# Patient Record
Sex: Male | Born: 1985 | Hispanic: Yes | Marital: Single | Smoking: Never smoker
Health system: Southern US, Community
[De-identification: ages and names within clinical notes are randomized; demographics above are authoritative.]

## PROBLEM LIST (undated history)

## (undated) DIAGNOSIS — H938X9 Other specified disorders of ear, unspecified ear: Secondary | ICD-10-CM

---

## 2018-01-14 ENCOUNTER — Other Ambulatory Visit: Payer: Self-pay | Admitting: Otolaryngology

## 2018-01-14 DIAGNOSIS — H938X1 Other specified disorders of right ear: Secondary | ICD-10-CM

## 2018-01-23 ENCOUNTER — Ambulatory Visit
Admission: RE | Admit: 2018-01-23 | Discharge: 2018-01-23 | Disposition: A | Discharge status: Home / Self Care | Payer: No Typology Code available for payment source | Source: Ambulatory Visit | Attending: Otolaryngology | Admitting: Otolaryngology

## 2018-01-23 DIAGNOSIS — H938X1 Other specified disorders of right ear: Secondary | ICD-10-CM

## 2018-02-13 NOTE — H&P (Signed)
  HPI:   Ryan Brooks is a 32 y.o. male who presents as a consult Patient.   Referring Provider: Barry BrunnerLonergan, Malia, PA-C  Chief complaint: Ear problem.  HPI: 2-week history of clogged up right ear. He did not have any trouble with the ear prior to that as far as he knows. He is having hard time hearing from that ear.  PMH/Meds/All/SocHx/FamHx/ROS:   History reviewed. No pertinent past medical history.  History reviewed. No pertinent surgical history.  No family history of bleeding disorders, wound healing problems or difficulty with anesthesia.   Social History   Social History  . Marital status: N/A  Spouse name: N/A  . Number of children: N/A  . Years of education: N/A   Occupational History  . Not on file.   Social History Main Topics  . Smoking status: Never Smoker  . Smokeless tobacco: Never Used  . Alcohol use Not on file  . Drug use: Unknown  . Sexual activity: Not on file   Other Topics Concern  . Not on file   Social History Narrative  . No narrative on file   Current Outpatient Prescriptions:  . neomycin-polymyxin-hydrocortisone (CORTISPORIN) 3.5-10,000-1 mg/mL-unit/mL-% otic suspension, Place 3 drops into the right ear 4 times daily for 14 days., Disp: 10 mL, Rfl: 1  A complete ROS was performed with pertinent positives/negatives noted in the HPI. The remainder of the ROS are negative.   Physical Exam:   There were no vitals taken for this visit.  General: Healthy and alert, in no distress, breathing easily. Normal affect. In a pleasant mood. Head: Normocephalic, atraumatic. No masses, or scars. Eyes: Pupils are equal, and reactive to light. Vision is grossly intact. No spontaneous or gaze nystagmus. Ears: Left ear canal, tympanic membrane and middle ear are normal to inspection. Right ear canal completely filled with polypoid mass. Part of this was removed for biopsy. Topical Afrin was applied for hemostasis. Cortisporin was then dripped into the ear  canal. Hearing: Grossly normal. Nose: Nasal cavities are clear with healthy mucosa, no polyps or exudate.Airways are patent. Face: No masses or scars, facial nerve function is symmetric. Oral Cavity: No mucosal abnormalities are noted. Tongue with normal mobility. Dentition appears healthy. Oropharynx: Tonsils are symmetric. There are no mucosal masses identified. Tongue base appears normal and healthy. Larynx/Hypopharynx: deferred Chest: Deferred Neck: No palpable masses, no cervical adenopathy, no thyroid nodules or enlargement. Neuro: Cranial nerves II-XII will normal function. Balance: Normal gate. Other findings: none.  Independent Review of Additional Tests or Records:  none  Procedures:  Procedure note:  Indications: External otitis  Details of ear canal cleaning were discussed with the patient and all questions were answered.  Procedure:  Using the operating microscope, the right side was cleaned of polypoid mass using otologic forceps.   He tolerated this procedure well. There were no complications.  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 Await pathology. Start on Cortisporin drops for now. Recheck in 1 week.

## 2018-02-20 ENCOUNTER — Other Ambulatory Visit: Payer: Self-pay

## 2018-02-20 ENCOUNTER — Encounter (HOSPITAL_COMMUNITY): Payer: Self-pay | Admitting: *Deleted

## 2018-02-20 NOTE — Progress Notes (Signed)
Pt SDW-Pre-op call completed using [employer], Katie # [phone]. Pt denies SOB, chest pain, and being under the care of a cardiologist. Pt denies having a stress test, echo and cardiac cath. Pt denies having an EKG and chest x ray within the last year. Pt denies recent labs. Pt made aware to stop taking Aspirin, vitamins, fish oil and herbal medications. Do not take any NSAIDs ie: Ibuprofen, Advil, Naproxen (Aleve), Motrin, BC and Goody Powder. Pt verbalized understanding of all pre-op instructions.

## 2018-02-20 NOTE — Anesthesia Preprocedure Evaluation (Signed)
Anesthesia Evaluation  Patient identified by MRN, date of birth, ID band Patient awake    Reviewed: Allergy & Precautions, H&P , Patient's Chart, lab work & pertinent test results, reviewed documented beta blocker date and time   Airway Mallampati: II  TM Distance: >3 FB Neck ROM: full    Dental no notable dental hx.    Pulmonary    Pulmonary exam normal breath sounds clear to auscultation       Cardiovascular  Rhythm:regular Rate:Normal     Neuro/Psych    GI/Hepatic   Endo/Other    Renal/GU      Musculoskeletal   Abdominal   Peds  Hematology   Anesthesia Other Findings   Reproductive/Obstetrics                             Anesthesia Physical Anesthesia Plan  ASA: II  Anesthesia Plan: General   Post-op Pain Management:    Induction: Intravenous  PONV Risk Score and Plan: 2 and Treatment may vary due to age or medical condition, Dexamethasone and Ondansetron  Airway Management Planned: LMA  Additional Equipment:   Intra-op Plan:   Post-operative Plan:   Informed Consent: I have reviewed the patients History and Physical, chart, labs and discussed the procedure including the risks, benefits and alternatives for the proposed anesthesia with the patient or authorized representative who has indicated his/her understanding and acceptance.   Dental Advisory Given  Plan Discussed with: CRNA and Surgeon  Anesthesia Plan Comments: ( )        Anesthesia Quick Evaluation

## 2018-02-21 ENCOUNTER — Ambulatory Visit (HOSPITAL_COMMUNITY)
Admission: RE | Admit: 2018-02-21 | Discharge: 2018-02-21 | Disposition: A | Discharge status: Home / Self Care | Payer: No Typology Code available for payment source | Source: Ambulatory Visit | Attending: Otolaryngology | Admitting: Otolaryngology

## 2018-02-21 ENCOUNTER — Encounter (HOSPITAL_COMMUNITY)
Admission: RE | Disposition: A | Discharge status: Home / Self Care | Payer: Self-pay | Source: Ambulatory Visit | Attending: Otolaryngology

## 2018-02-21 ENCOUNTER — Encounter (HOSPITAL_COMMUNITY): Payer: Self-pay | Admitting: General Practice

## 2018-02-21 ENCOUNTER — Ambulatory Visit (HOSPITAL_COMMUNITY): Payer: Self-pay | Admitting: Anesthesiology

## 2018-02-21 DIAGNOSIS — H938X1 Other specified disorders of right ear: Secondary | ICD-10-CM | POA: Insufficient documentation

## 2018-02-21 DIAGNOSIS — H608X1 Other otitis externa, right ear: Secondary | ICD-10-CM | POA: Insufficient documentation

## 2018-02-21 HISTORY — PX: EAR CYST EXCISION: SHX22

## 2018-02-21 HISTORY — DX: Other specified disorders of ear, unspecified ear: H93.8X9

## 2018-02-21 SURGERY — EXCISION, CYST, EAR
Anesthesia: General | Site: Ear | Laterality: Right

## 2018-02-21 MED ORDER — 0.9 % SODIUM CHLORIDE (POUR BTL) OPTIME
TOPICAL | Status: DC | PRN
  Administered 2018-02-21: 1000 mL

## 2018-02-21 MED ORDER — LACTATED RINGERS IV SOLN
INTRAVENOUS | Status: DC | PRN
  Administered 2018-02-21: 07:00:00 via INTRAVENOUS

## 2018-02-21 MED ORDER — ONDANSETRON HCL 4 MG/2ML IJ SOLN
INTRAMUSCULAR | Status: AC
  Filled 2018-02-21: qty 2

## 2018-02-21 MED ORDER — OXYMETAZOLINE HCL 0.05 % NA SOLN
NASAL | Status: DC | PRN
  Administered 2018-02-21: 1

## 2018-02-21 MED ORDER — FENTANYL CITRATE (PF) 100 MCG/2ML IJ SOLN: 25.0000 ug | INTRAMUSCULAR | Status: DC | PRN

## 2018-02-21 MED ORDER — FENTANYL CITRATE (PF) 250 MCG/5ML IJ SOLN
INTRAMUSCULAR | Status: AC
  Filled 2018-02-21: qty 5

## 2018-02-21 MED ORDER — ONDANSETRON HCL 4 MG/2ML IJ SOLN
INTRAMUSCULAR | Status: DC | PRN
  Administered 2018-02-21: 4 mg via INTRAVENOUS

## 2018-02-21 MED ORDER — DEXAMETHASONE SODIUM PHOSPHATE 10 MG/ML IJ SOLN
INTRAMUSCULAR | Status: AC
  Filled 2018-02-21: qty 1

## 2018-02-21 MED ORDER — LIDOCAINE-EPINEPHRINE 1 %-1:100000 IJ SOLN
INTRAMUSCULAR | Status: AC
  Filled 2018-02-21: qty 1

## 2018-02-21 MED ORDER — PROPOFOL 10 MG/ML IV BOLUS
INTRAVENOUS | Status: DC | PRN
  Administered 2018-02-21: 200 mg via INTRAVENOUS

## 2018-02-21 MED ORDER — OXYMETAZOLINE HCL 0.05 % NA SOLN
NASAL | Status: AC
  Filled 2018-02-21: qty 15

## 2018-02-21 MED ORDER — CIPROFLOXACIN-DEXAMETHASONE 0.3-0.1 % OT SUSP
OTIC | Status: DC | PRN
  Administered 2018-02-21: 4 [drp] via OTIC

## 2018-02-21 MED ORDER — LIDOCAINE 2% (20 MG/ML) 5 ML SYRINGE
INTRAMUSCULAR | Status: DC | PRN
  Administered 2018-02-21: 100 mg via INTRAVENOUS

## 2018-02-21 MED ORDER — FENTANYL CITRATE (PF) 100 MCG/2ML IJ SOLN
INTRAMUSCULAR | Status: DC | PRN
  Administered 2018-02-21: 50 ug via INTRAVENOUS

## 2018-02-21 MED ORDER — PROPOFOL 10 MG/ML IV BOLUS
INTRAVENOUS | Status: AC
  Filled 2018-02-21: qty 20

## 2018-02-21 MED ORDER — LIDOCAINE HCL (CARDIAC) 20 MG/ML IV SOLN
INTRAVENOUS | Status: AC
  Filled 2018-02-21: qty 5

## 2018-02-21 MED ORDER — MIDAZOLAM HCL 2 MG/2ML IJ SOLN
INTRAMUSCULAR | Status: AC
  Filled 2018-02-21: qty 2

## 2018-02-21 MED ORDER — DEXAMETHASONE SODIUM PHOSPHATE 10 MG/ML IJ SOLN
INTRAMUSCULAR | Status: DC | PRN
  Administered 2018-02-21: 10 mg via INTRAVENOUS

## 2018-02-21 MED ORDER — CIPROFLOXACIN-DEXAMETHASONE 0.3-0.1 % OT SUSP
OTIC | Status: AC
  Filled 2018-02-21: qty 7.5

## 2018-02-21 MED ORDER — LIDOCAINE-EPINEPHRINE 1 %-1:100000 IJ SOLN
INTRAMUSCULAR | Status: DC | PRN
  Administered 2018-02-21: 20 mL

## 2018-02-21 SURGICAL SUPPLY — 28 items
BLADE SURG 15 STRL LF DISP TIS (BLADE) IMPLANT
BLADE SURG 15 STRL SS (BLADE)
CANISTER SUCT 3000ML PPV (MISCELLANEOUS) ×3 IMPLANT
COTTONBALL LRG STERILE PKG (GAUZE/BANDAGES/DRESSINGS) ×3 IMPLANT
DRAPE HALF SHEET 40X57 (DRAPES) IMPLANT
DRAPE MICROSCOPE LEICA 54X105 (DRAPE) IMPLANT
DRESSING NASAL KENNEDY 3.5X.9 (MISCELLANEOUS) ×1 IMPLANT
DRSG NASAL KENNEDY 3.5X.9 (MISCELLANEOUS) ×3
GLOVE BIOGEL PI IND STRL 7.5 (GLOVE) ×2 IMPLANT
GLOVE BIOGEL PI IND STRL 8 (GLOVE) ×1 IMPLANT
GLOVE BIOGEL PI INDICATOR 7.5 (GLOVE) ×4
GLOVE BIOGEL PI INDICATOR 8 (GLOVE) ×2
GLOVE ECLIPSE 7.5 STRL STRAW (GLOVE) ×3 IMPLANT
GLOVE ECLIPSE 8.0 STRL XLNG CF (GLOVE) ×6 IMPLANT
GLOVE SURG SS PI 7.0 STRL IVOR (GLOVE) ×6 IMPLANT
GOWN STRL REUS W/ TWL LRG LVL3 (GOWN DISPOSABLE) ×1 IMPLANT
GOWN STRL REUS W/ TWL XL LVL3 (GOWN DISPOSABLE) ×2 IMPLANT
GOWN STRL REUS W/TWL LRG LVL3 (GOWN DISPOSABLE) ×2
GOWN STRL REUS W/TWL XL LVL3 (GOWN DISPOSABLE) ×4
KIT BASIN OR (CUSTOM PROCEDURE TRAY) ×3 IMPLANT
KIT TURNOVER KIT B (KITS) ×3 IMPLANT
NEEDLE PRECISIONGLIDE 27X1.5 (NEEDLE) ×3 IMPLANT
PAD ARMBOARD 7.5X6 YLW CONV (MISCELLANEOUS) ×6 IMPLANT
STAPLER VISISTAT 35W (STAPLE) IMPLANT
TOWEL OR 17X24 6PK STRL BLUE (TOWEL DISPOSABLE) IMPLANT
TRAY ENT MC OR (CUSTOM PROCEDURE TRAY) ×3 IMPLANT
TUBE CONNECTING 12'X1/4 (SUCTIONS)
TUBE CONNECTING 12X1/4 (SUCTIONS) IMPLANT

## 2018-02-21 NOTE — Anesthesia Postprocedure Evaluation (Signed)
Anesthesia Post Note  Patient: Ryan Brooks  Procedure(s) Performed: EXCISION EAR CYST RIGHT EAR (Right Ear)     Patient location during evaluation: PACU Anesthesia Type: General Level of consciousness: awake and alert Pain management: pain level controlled Vital Signs Assessment: post-procedure vital signs reviewed and stable Respiratory status: spontaneous breathing, nonlabored ventilation, respiratory function stable and patient connected to nasal cannula oxygen Cardiovascular status: blood pressure returned to baseline and stable Postop Assessment: no apparent nausea or vomiting Anesthetic complications: no    Last Vitals:  Vitals:   02/21/18 0840 02/21/18 0848  BP: (!) 119/91 136/87  Pulse: 74 76  Resp: 12   Temp: 36.6 C 36.6 C  SpO2: 100% 99%    Last Pain:  Vitals:   02/21/18 0848  TempSrc:   PainSc: 5                  Billiejo Sorto EDWARD

## 2018-02-21 NOTE — Discharge Instructions (Signed)
Use the supplied eardrops, 3 drops in each ear, 3 times each day for 3 days. The first dose has already been given during surgery. Keep any remainders as you may need them in the future.  

## 2018-02-21 NOTE — Interval H&P Note (Signed)
History and Physical Interval Note:  02/21/2018 7:26 AM  Ryan Brooks  has presented today for surgery, with the diagnosis of Right ear canal mass  The various methods of treatment have been discussed with the patient and family. After consideration of risks, benefits and other options for treatment, the patient has consented to  Procedure(s) with comments: EXCISION EAR CYST (Right) - Excision of right ear canal mass as a surgical intervention .  The patient's history has been reviewed, patient examined, no change in status, stable for surgery.  I have reviewed the patient's chart and labs.  Questions were answered to the patient's satisfaction.     Serena ColonelJefry Eusebia Grulke

## 2018-02-21 NOTE — Anesthesia Procedure Notes (Signed)
Procedure Name: LMA Insertion Date/Time: 02/21/2018 7:35 AM Performed by: Sheppard EvensManess, Makenzey Nanni B, CRNA Pre-anesthesia Checklist: Patient identified, Emergency Drugs available, Suction available and Patient being monitored Patient Re-evaluated:Patient Re-evaluated prior to induction Oxygen Delivery Method: Circle System Utilized Preoxygenation: Pre-oxygenation with 100% oxygen Induction Type: IV induction Ventilation: Mask ventilation without difficulty LMA: LMA inserted LMA Size: 4.0 Number of attempts: 1 Airway Equipment and Method: Bite block Placement Confirmation: positive ETCO2 Tube secured with: Tape Dental Injury: Teeth and Oropharynx as per pre-operative assessment

## 2018-02-21 NOTE — Op Note (Signed)
OPERATIVE REPORT  DATE OF SURGERY: 02/21/2018  PATIENT:  Ryan Brooks,  32 y.o. male  PRE-OPERATIVE DIAGNOSIS:  Right ear canal mass  POST-OPERATIVE DIAGNOSIS: Right ear canal mass   PROCEDURE:  Procedure(s): EXCISION EAR canal mass   SURGEON:  Susy FrizzleJefry H Zadin Lange, MD  ASSISTANTS: None  ANESTHESIA:   General   EBL: 10 ml  DRAINS: None  LOCAL MEDICATIONS USED: 1% lidocaine with epinephrine  SPECIMEN: Right ear canal mass  COUNTS:  Correct  PROCEDURE DETAILS: The patient was taken to the operating room and placed on the operating table in the supine position. Following induction of general endotracheal anesthesia, the right ear was prepped and draped in a standard fashion.  Operating microscope was used throughout the case.  The ear was inspected using a nasal speculum.  Large mass was seen arising from the meatus.  Local anesthetic was infiltrated circumferentially around the ear canal.  The mass seemed to be attached to the posterior canal skin.  It was removed in a piecemeal fashion using cup forceps and Takahashi forceps.  The attachment was more clearly identified after the mass was removed and was just lateral to the isthmus and the posterior canal skin.  Entire lesion was removed and sent for pathologic evaluation.  Topical Afrin was applied on a cotton ball for hemostasis.  A small nasal Merocel was cut to size and shape and used as an ear dressing, placed in the ear canal and saturated with Ciprodex suspension.  Cotton ball was placed at the meatus.  Patient was awakened from anesthesia, extubated and transferred to recovery in stable condition.    PATIENT DISPOSITION:  To PACU, stable

## 2018-02-21 NOTE — Transfer of Care (Signed)
Immediate Anesthesia Transfer of Care Note  Patient: Ryan Brooks  Procedure(s) Performed: EXCISION EAR CYST RIGHT EAR (Right Ear)  Patient Location: PACU  Anesthesia Type:General  Level of Consciousness: awake, alert  and oriented  Airway & Oxygen Therapy: Patient Spontanous Breathing and Patient connected to nasal cannula oxygen  Post-op Assessment: Report given to RN and Post -op Vital signs reviewed and stable  Post vital signs: Reviewed and stable  Last Vitals:  Vitals Value Taken Time  BP 114/71 02/21/2018  8:14 AM  Temp    Pulse 75 02/21/2018  8:14 AM  Resp 14 02/21/2018  8:14 AM  SpO2 97 % 02/21/2018  8:14 AM  Vitals shown include unvalidated device data.  Last Pain:  Vitals:   02/21/18 0650  TempSrc:   PainSc: 0-No pain      Patients Stated Pain Goal: 3 (02/21/18 0650)  Complications: No apparent anesthesia complications

## 2018-02-22 ENCOUNTER — Encounter (HOSPITAL_COMMUNITY): Payer: Self-pay | Admitting: Otolaryngology

## 2019-11-08 IMAGING — CT CT TEMPORAL BONES W/O CM
5 of 7 series · 16 of 40 positions shown, 17 images · non-contrast
Comparison: No prior imaging.

CLINICAL DATA: 31-year-old male with right ear canal mass.
Drainage, pain and decreased hearing from the ear. Symptoms for 3
months. No improvement with antibiotics.

EXAM:
CT TEMPORAL BONES WITHOUT CONTRAST
TECHNIQUE: Axial and coronal plane CT imaging of the petrous temporal bones was
performed with thin-collimation image reconstruction. No intravenous
contrast was administered. Multiplanar CT image reconstructions were
also generated.

[Series 3: temperal bones 0.60 hr60 s3 ax bone · axial · 0.37mm/px · z∈[-486,-447]mm · 4 of 112 slices shown, 5 images]
[im 23/112  brain]
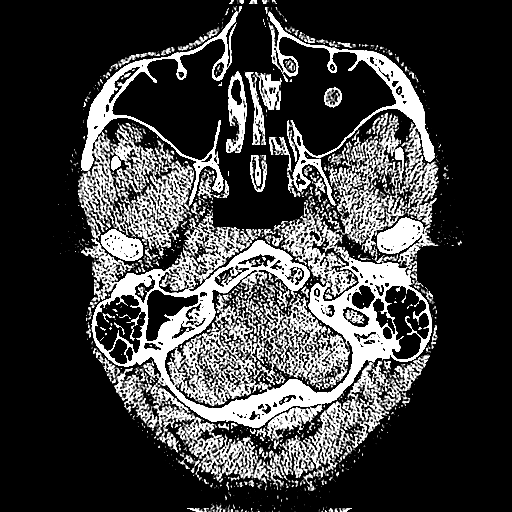
[im 23/112  bone]
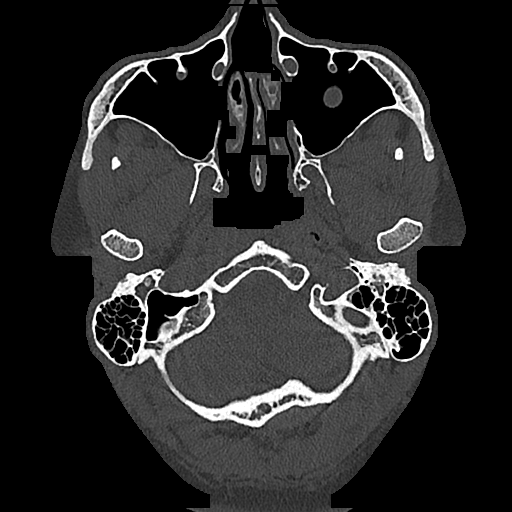
[im 45/112  bone]
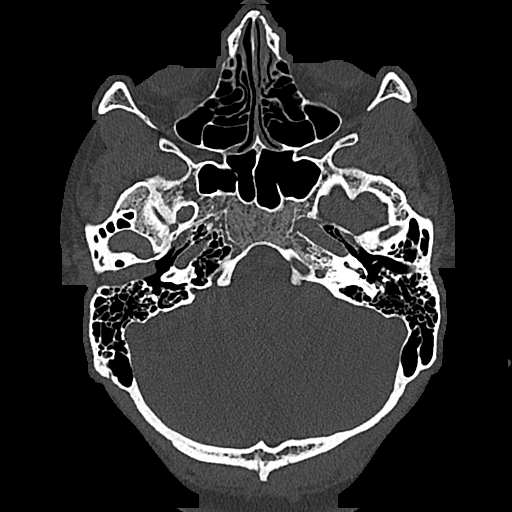
[im 67/112  bone]
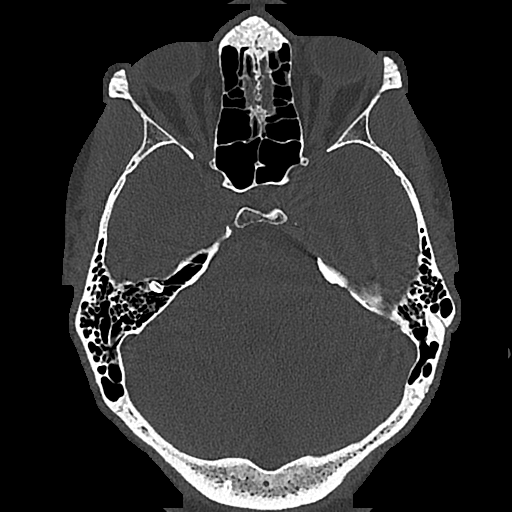
[im 89/112  bone]
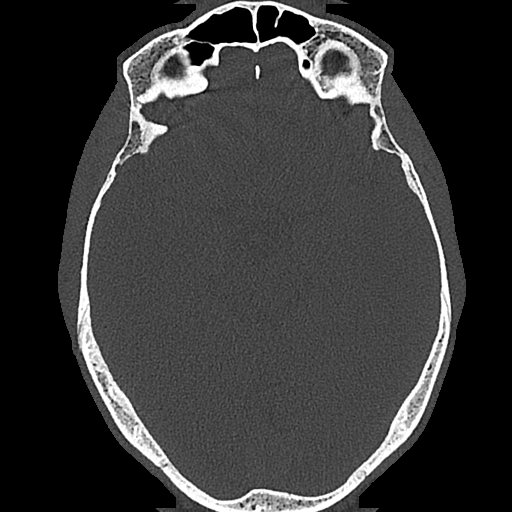

[Series 5: temperal bones 0.60 hr60 s3 cor bone · axial · 0.18mm/px · z∈[-486,-451]mm · 3 of 116 slices shown (1 of 2)]
[im 29/116  bone]
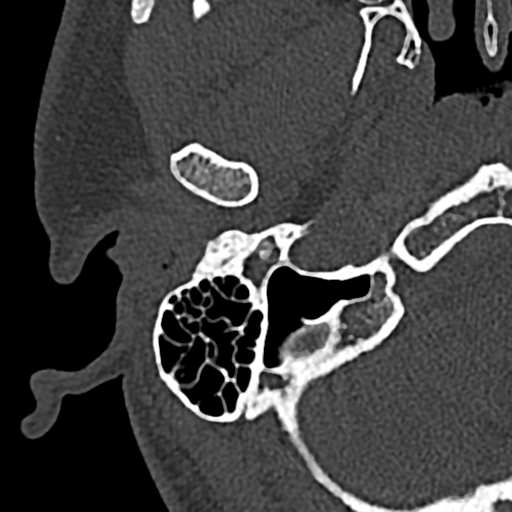
[im 58/116  bone]
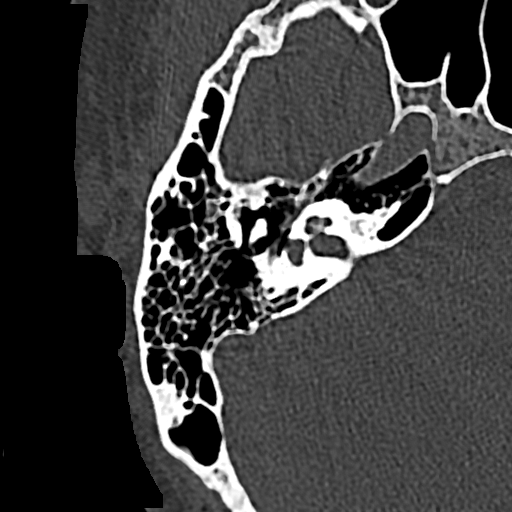
[im 87/116  bone]
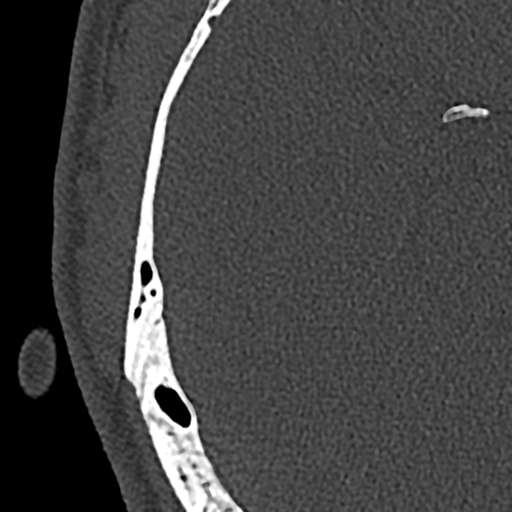

[Series 6: temperal bones 0.60 hr60 s3 ax mag right · axial · 0.20mm/px · z∈[-485,-450]mm · 3 of 116 slices shown]
[im 29/116  bone]
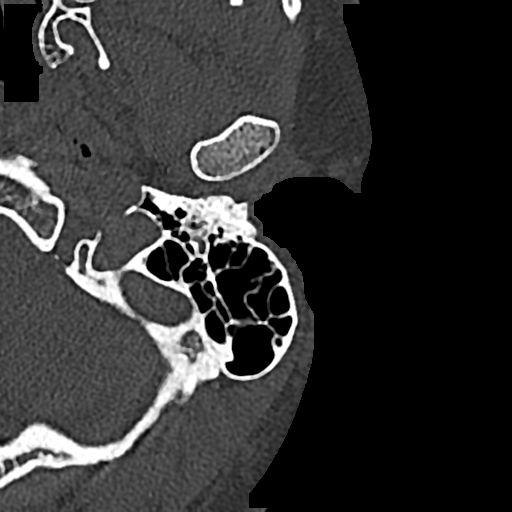
[im 58/116  bone]
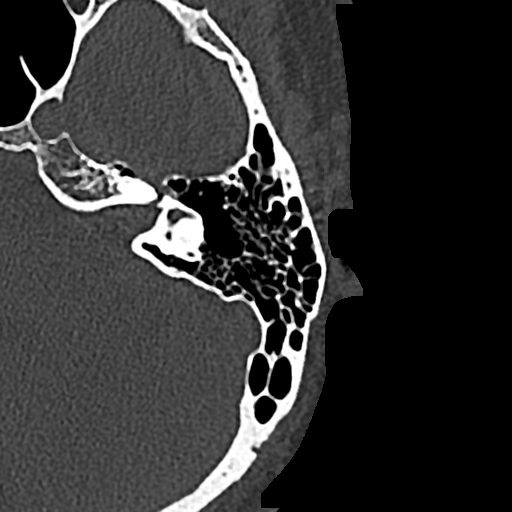
[im 87/116  bone]
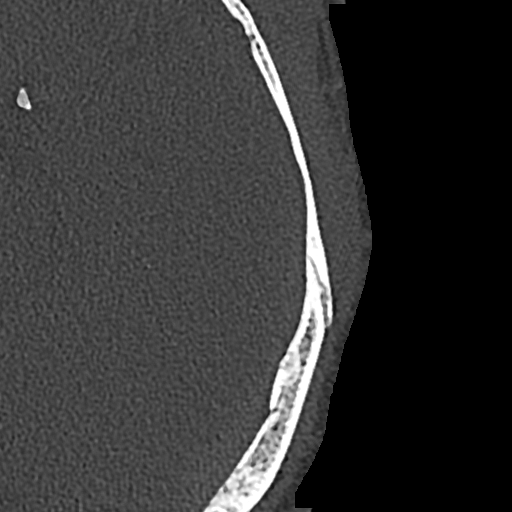

[Series 14: temperal bones 0.60 hr60 s3 cor bone · axial · 0.28mm/px · z∈[-486,-451]mm · 3 of 116 slices shown (2 of 2)]
[im 29/116  bone]
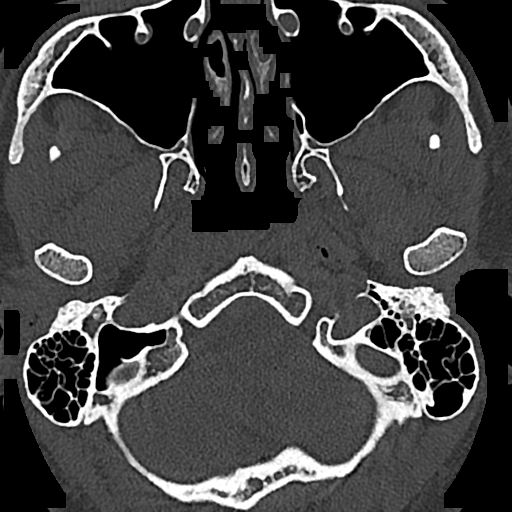
[im 58/116  bone]
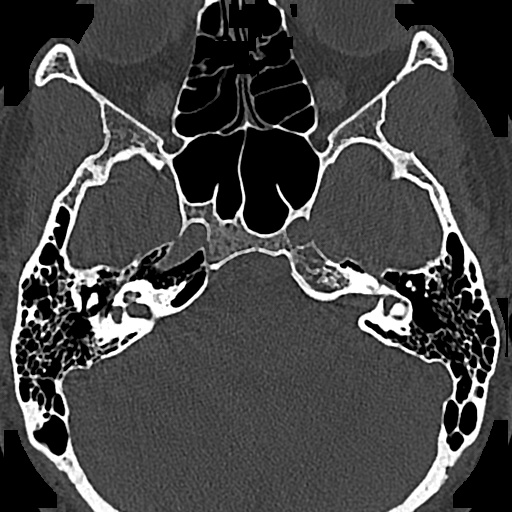
[im 87/116  bone]
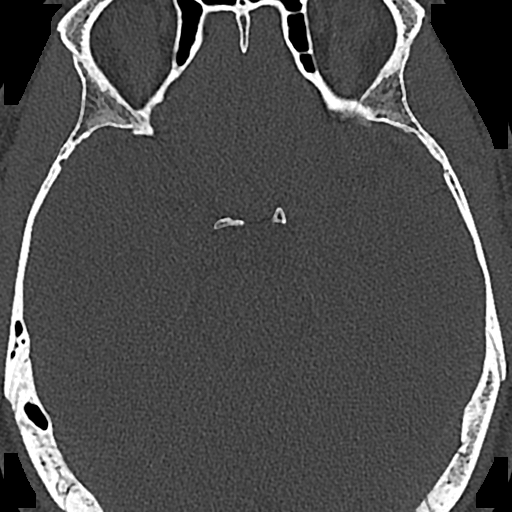

[Series 19: temperal bones 0.80 hr60 cor left cor bone · coronal · 0.18mm/px · 3 of 179 slices shown]
[im 60/179  bone]
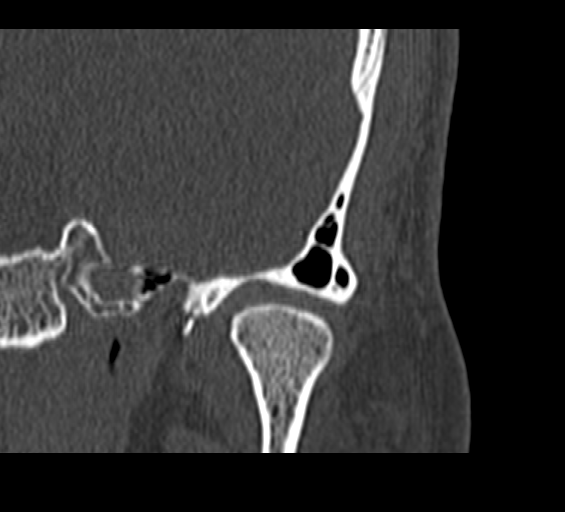
[im 80/179  bone]
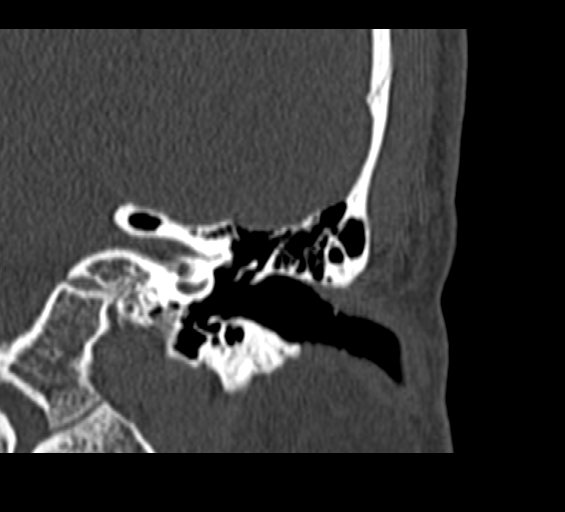
[im 99/179  bone]
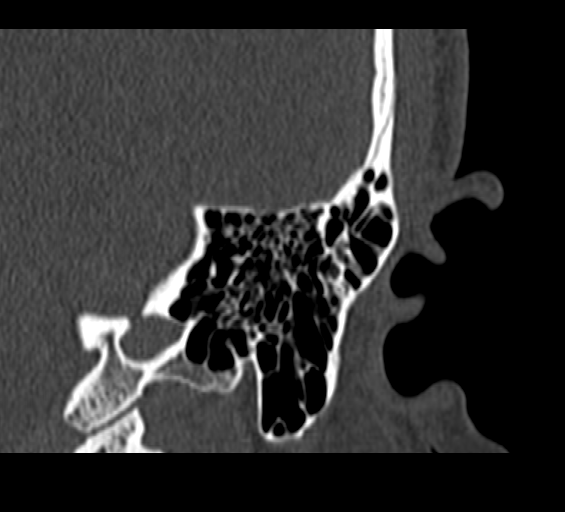

[16 of 40 positions shown; findings below may reference images not displayed]

FINDINGS: Normal visible noncontrast brain parenchyma. Visualized orbits and
scalp soft tissues are within normal limits. Aside from the right
periauricular soft tissues, negative visible noncontrast deep soft
tissue spaces of the face

Overall normal bone mineralization throughout the skull base. The
visible paranasal sinuses are clear. Negative visible nasopharynx.

RIGHT TEMPORAL BONE:

There is intermediate density material throughout the right EAC,
fairly isodense to skeletal muscle. However, the right pinna and
other right periauricular soft tissues appear normal. The visible
right parotid space is normal. There are several nonenlarged lymph
nodes in the right pre-auricular space.

There are few superimposed air bubbles in the deep aspect of the
right EAC (series 5, image 72). No erosion of the bony walls of the
EAC is identified. The right EAC canal size appears comparable to
that on the left.

There is inward bulging of the right tympanic membrane (series 5,
image 68). The right tympanic cavity is normally pneumatized. The
scutum is intact. The ossicles are intact and normally aligned.

The right mastoid antrum and mastoid air cells are clear, including
those closely bordering the posterior and superior walls of the
right EAC.

The right IAC, cochlea, vestibule, semicircular canals, and course
of the right 7th nerve appear normal.

LEFT TEMPORAL BONE:

The left EAC appears normal. The left tympanic membrane, left
tympanic cavity, ossicles, and mastoids are normal. The left IAC,
cochlea, vestibule, semicircular canals and course of the left 7th
nerve appear normal.
IMPRESSION: 1. Near complete opacification of the right EAC, with trace
superimposed gas deep within the canal, not associated with any bony
erosion or other periauricular soft tissue abnormality. The right
tympanic membrane is bulging inward, but the right tympanic cavity
and remainder of the right temporal bone is normal.
The appearance is nonspecific, but an acquired benign process is
favored.
2. Normal left temporal bone.
